# Patient Record
Sex: Female | Born: 1995 | Race: White | Hispanic: No | Marital: Single
Health system: Southern US, Community
[De-identification: ages and names within clinical notes are randomized; demographics above are authoritative.]

---

## 2017-08-06 ENCOUNTER — Encounter (HOSPITAL_COMMUNITY): Payer: Self-pay

## 2017-08-06 ENCOUNTER — Emergency Department (HOSPITAL_COMMUNITY)
Admission: EM | Admit: 2017-08-06 | Discharge: 2017-08-07 | Disposition: A | Discharge status: Home / Self Care | Payer: Self-pay | Attending: Emergency Medicine | Admitting: Emergency Medicine

## 2017-08-06 DIAGNOSIS — R1011 Right upper quadrant pain: Secondary | ICD-10-CM | POA: Insufficient documentation

## 2017-08-06 DIAGNOSIS — R1110 Vomiting, unspecified: Secondary | ICD-10-CM | POA: Insufficient documentation

## 2017-08-06 LAB — URINALYSIS, ROUTINE W REFLEX MICROSCOPIC
Bilirubin Urine: NEGATIVE
GLUCOSE, UA: NEGATIVE mg/dL
HGB URINE DIPSTICK: NEGATIVE
Ketones, ur: 80 mg/dL — AB
LEUKOCYTES UA: NEGATIVE
Nitrite: NEGATIVE
Protein, ur: NEGATIVE mg/dL
SPECIFIC GRAVITY, URINE: 1.018 (ref 1.005–1.030)
pH: 6 (ref 5.0–8.0)

## 2017-08-06 LAB — COMPREHENSIVE METABOLIC PANEL
ALT: 17 U/L (ref 14–54)
ANION GAP: 8 (ref 5–15)
AST: 21 U/L (ref 15–41)
Albumin: 4.4 g/dL (ref 3.5–5.0)
Alkaline Phosphatase: 44 U/L (ref 38–126)
BUN: 6 mg/dL (ref 6–20)
CHLORIDE: 105 mmol/L (ref 101–111)
CO2: 24 mmol/L (ref 22–32)
CREATININE: 0.61 mg/dL (ref 0.44–1.00)
Calcium: 9.1 mg/dL (ref 8.9–10.3)
GFR calc non Af Amer: >60 mL/min (ref >60)
Glucose, Bld: 100 mg/dL — ABNORMAL HIGH (ref 65–99)
POTASSIUM: 3.8 mmol/L (ref 3.5–5.1)
SODIUM: 137 mmol/L (ref 135–145)
Total Bilirubin: 0.6 mg/dL (ref 0.3–1.2)
Total Protein: 7.2 g/dL (ref 6.5–8.1)

## 2017-08-06 LAB — CBC
HEMATOCRIT: 36.2 % (ref 36.0–46.0)
HEMOGLOBIN: 12.1 g/dL (ref 12.0–15.0)
MCH: 30.4 pg (ref 26.0–34.0)
MCHC: 33.4 g/dL (ref 30.0–36.0)
MCV: 91 fL (ref 78.0–100.0)
Platelets: 180 10*3/uL (ref 150–400)
RBC: 3.98 MIL/uL (ref 3.87–5.11)
RDW: 12.9 % (ref 11.5–15.5)
WBC: 8.7 10*3/uL (ref 4.0–10.5)

## 2017-08-06 LAB — I-STAT BETA HCG BLOOD, ED (MC, WL, AP ONLY): I-stat hCG, quantitative: <5 m[IU]/mL (ref <5)

## 2017-08-06 LAB — LIPASE, BLOOD: LIPASE: 20 U/L (ref 11–51)

## 2017-08-06 MED ORDER — ONDANSETRON 4 MG PO TBDP
4.0000 mg | ORAL_TABLET | Freq: Once | ORAL | Status: AC
  Administered 2017-08-07: 4 mg via ORAL
  Filled 2017-08-06: qty 1

## 2017-08-06 MED ORDER — GI COCKTAIL ~~LOC~~
30.0000 mL | Freq: Once | ORAL | Status: AC
  Administered 2017-08-07: 30 mL via ORAL
  Filled 2017-08-06: qty 30

## 2017-08-06 MED ORDER — ACETAMINOPHEN 325 MG PO TABS
650.0000 mg | ORAL_TABLET | Freq: Once | ORAL | Status: AC
  Administered 2017-08-07: 650 mg via ORAL
  Filled 2017-08-06: qty 2

## 2017-08-06 NOTE — ED Triage Notes (Addendum)
PT states that at work today she had an episode of mid, central abdominal pain and emesis that lasted about 45 mins accompanied by shaking and dizziness. Currently she feels better, but endorses epigastric pain. A&Ox4. Ambulatory.

## 2017-08-07 ENCOUNTER — Emergency Department (HOSPITAL_COMMUNITY): Payer: Self-pay

## 2017-08-07 MED ORDER — OMEPRAZOLE 20 MG PO CPDR: 20.0000 mg | DELAYED_RELEASE_CAPSULE | Freq: Every day | ORAL | 0 refills | Status: AC

## 2017-08-07 MED ORDER — ONDANSETRON 4 MG PO TBDP: 4.0000 mg | ORAL_TABLET | Freq: Three times a day (TID) | ORAL | 0 refills | Status: AC | PRN

## 2017-08-07 NOTE — Discharge Instructions (Signed)
All the results in the ER are normal, labs and imaging. We are not sure what is causing your symptoms. The workup in the ER is not complete, and is limited to screening for life threatening and emergent conditions only, so please see a primary care doctor for further evaluation. Start with clear liquid diet for the next 1-2 days and then slowly advance to formed foods.

## 2017-08-07 NOTE — ED Provider Notes (Signed)
[facility]-EMERGENCY DEPT Provider Note   CSN: [id number] Arrival date & time: 08/06/17  1818     History   Chief Complaint Chief Complaint  Patient presents with  . Emesis  . Abdominal Pain    HPI Caroline Pearson is a 21 y.o. female.  HPI Patient comes in with chief complaint of epigastric abdominal pain. Patient has history of gluten sensitivity. Patient reports that she was feeling well and went on to work, however around 5:00 she started having cramping in the epigastrium along with nausea and vomiting. Patient has had 5 episodes of emesis, non bilious and non bloody. Patient's pain is now improved but still present. Patient also continues to have nausea with dry heaving has involved resolved. atient is absolutely certain that she did not have any food in it. Patient reports that over the past few weeks she has noticed increased bloating and abdominal discomfort. She isn't due to products and she does indicate family history of IBS and hepatobiliary disease.  History reviewed. No pertinent past medical history.  There are no active problems to display for this patient.   No past surgical history on file.  OB History    No data available       Home Medications    Prior to Admission medications   Not on File    Family History History reviewed. No pertinent family history.  Social History Social History  Substance Use Topics  . Smoking status: Not on file  . Smokeless tobacco: Not on file  . Alcohol use Not on file     Allergies   Patient has no known allergies.   Review of Systems Review of Systems  Constitutional: Positive for activity change.  Gastrointestinal: Positive for abdominal pain, nausea and vomiting.  Genitourinary: Negative for dysuria.  Allergic/Immunologic: Positive for food allergies.     Physical Exam Updated Vital Signs BP 107/81 (BP Location: Left Arm)   Pulse 99   Temp 97.7 F (36.5 C) (Oral)   Resp (!) 24   Ht  (1.6 m)    Wt 61.2 kg (135 lb)   SpO2 100%   BMI 23.91 kg/m   Physical Exam  Constitutional: She is oriented to person, place, and time. She appears well-developed.  HENT:  Head: Normocephalic and atraumatic.  Eyes: EOM are normal.  Neck: Normal range of motion. Neck supple.  Cardiovascular: Normal rate.   Pulmonary/Chest: Effort normal.  Abdominal: Bowel sounds are normal. There is tenderness. There is no rebound and no guarding.  Epigastric and RUQ tenderness  Neurological: She is alert and oriented to person, place, and time.  Skin: Skin is warm and dry.  Nursing note and vitals reviewed.    ED Treatments / Results  Labs (all labs ordered are listed, but only abnormal results are displayed) Labs Reviewed  COMPREHENSIVE METABOLIC PANEL - Abnormal; Notable for the following:       Result Value   Glucose, Bld 100 (*)    All other components within normal limits  URINALYSIS, ROUTINE W REFLEX MICROSCOPIC - Abnormal; Notable for the following:    Ketones, ur 80 (*)    All other components within normal limits  LIPASE, BLOOD  CBC  I-STAT BETA HCG BLOOD, ED ([facility], [facility], [facility] ONLY)    EKG  EKG Interpretation None       Radiology No results found.  Procedures Procedures (including critical care time)  Medications Ordered in ED Medications  ondansetron (ZOFRAN-ODT) disintegrating tablet 4 mg (not administered)  acetaminophen (TYLENOL) tablet 650 mg (not administered)  gi cocktail (Maalox,Lidocaine,Donnatal) (not administered)     Initial Impression / Assessment and Plan / ED Course  I have reviewed the triage vital signs and the nursing notes.  Pertinent labs & imaging results that were available during my care of the patient were reviewed by me and considered in my medical decision making (see chart for details).  Clinical Course as of Aug 08 115  Thu Aug 07, 2017  0116 Results from the ER workup discussed with the patient face to face and all questions answered to the best  of my ability.  US Abdomen Limited RUQ [AN]    Clinical Course User Index [AN] Derwood Kaplan, MD    DDx includes: Pancreatitis Hepatobiliary pathology including cholecystitis Gastritis/PUD  Patient comes in with epigastric and right upper quadrant abdominal pain that started at 5 PM, with associated nausea and vomiting patient's family history of hepatobiliary disorder. UPPER QUADRANT AND GI LAB 7 ORDERED. PATIENT'S PAIN HAS IMPROVED.        Final Clinical Impressions(s) / ED Diagnoses   Final diagnoses:  RUQ abdominal pain    New Prescriptions New Prescriptions   No medications on file     Derwood Kaplan, MD 08/07/17 0116

## 2019-04-24 IMAGING — US US ABDOMEN LIMITED
1 series · 14 of 25 positions shown · non-contrast
Comparison: None.

CLINICAL DATA: Right upper quadrant pain with nausea and vomiting.

EXAM:
ULTRASOUND ABDOMEN LIMITED RIGHT UPPER QUADRANT

[Series 1: us abdomen limited · 0.14mm/px · 14 of 52 slices shown]
[im 1/52]
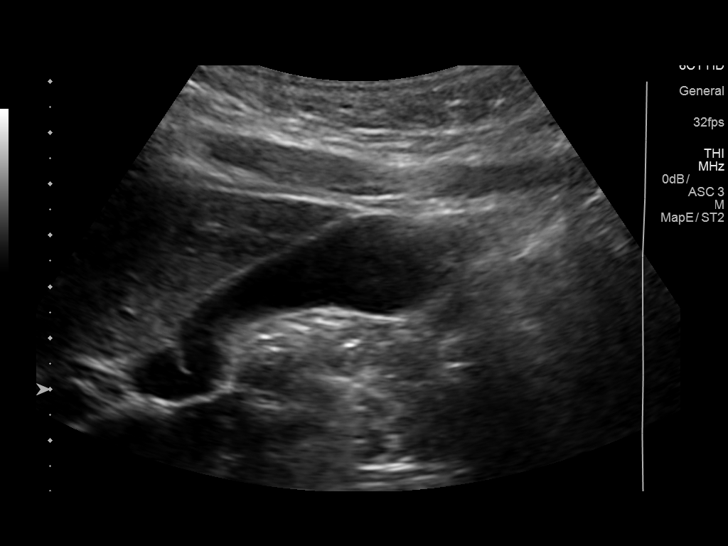
[im 5/52]
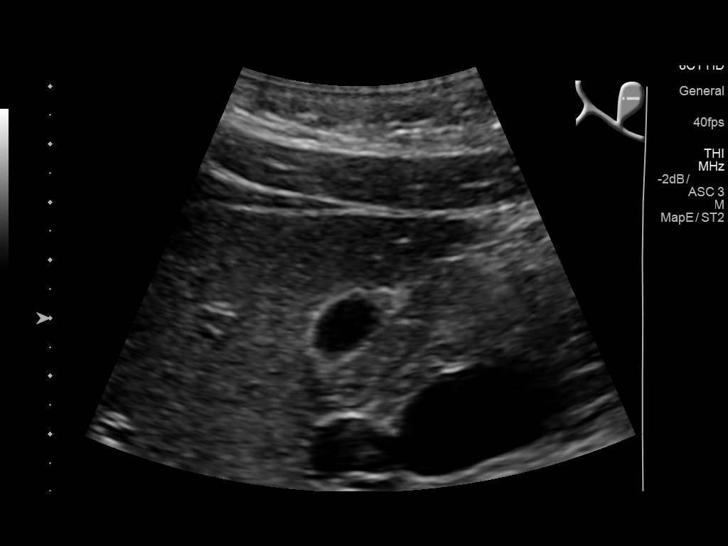
[im 9/52]
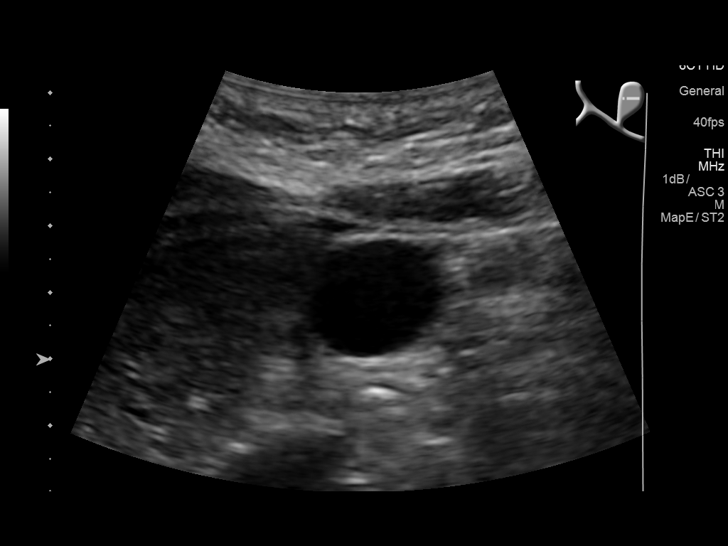
[im 13/52]
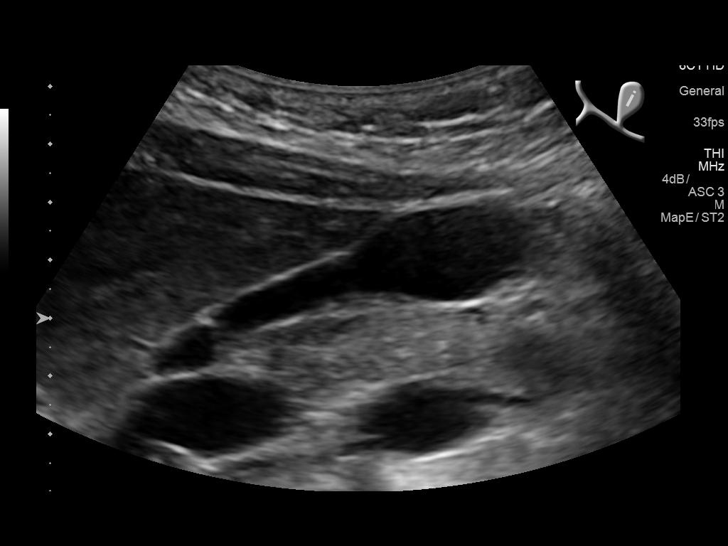
[im 18/52]
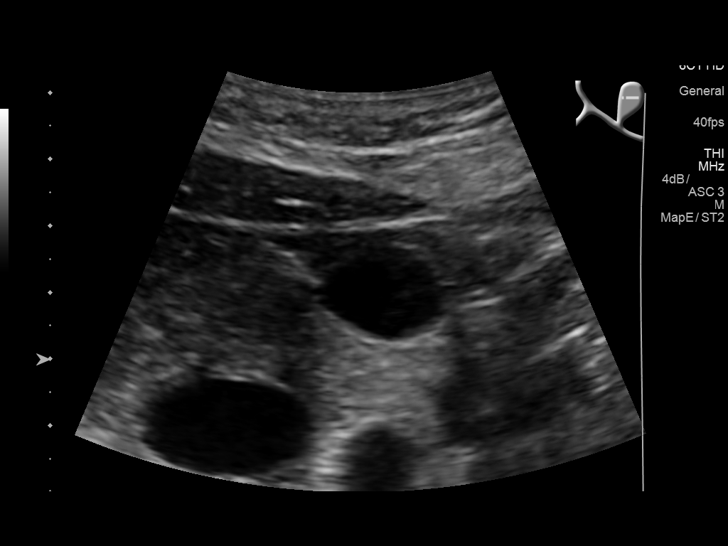
[im 20/52]
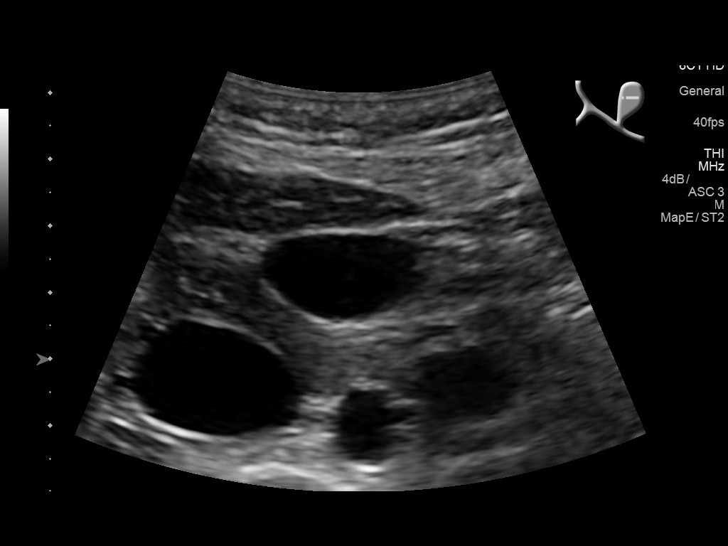
[im 24/52]
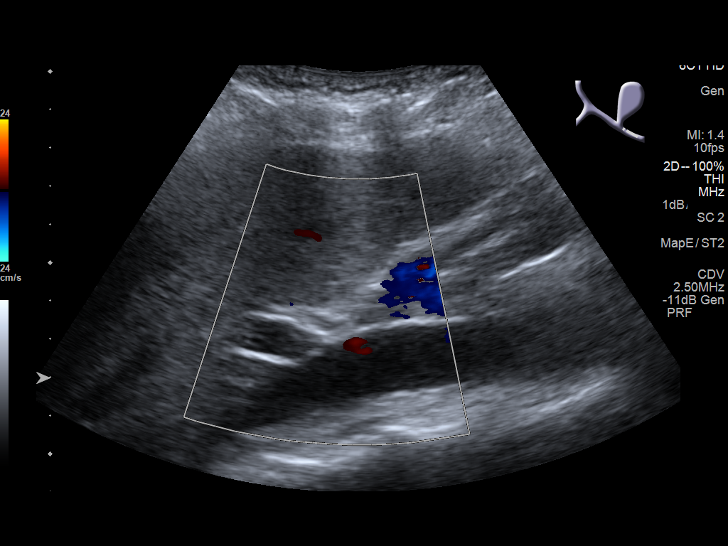
[im 28/52]
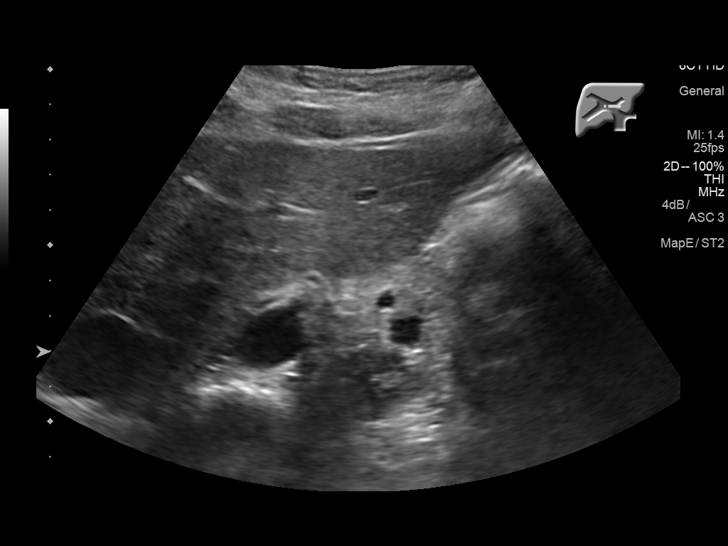
[im 32/52]
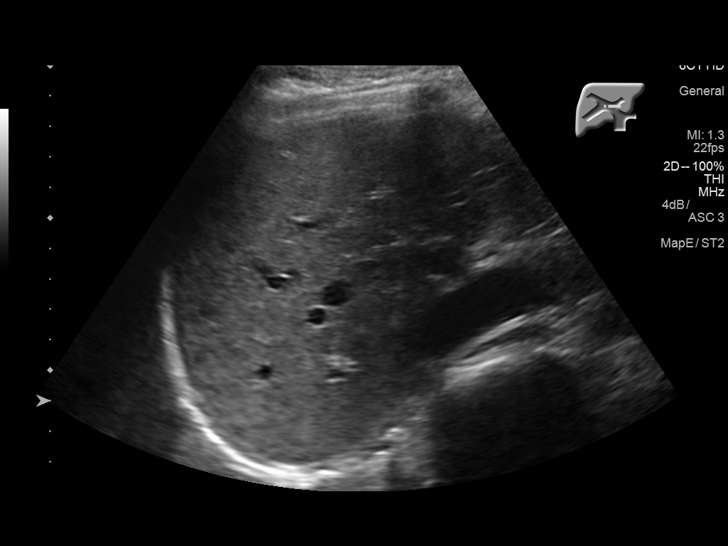
[im 35/52]
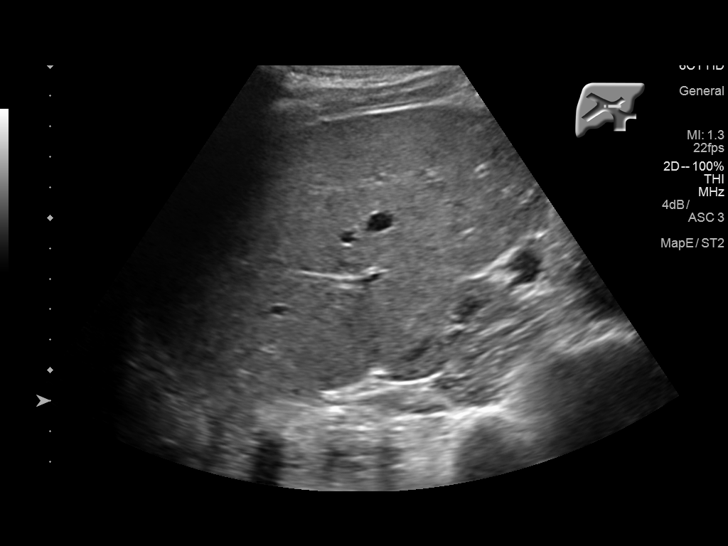
[im 39/52]
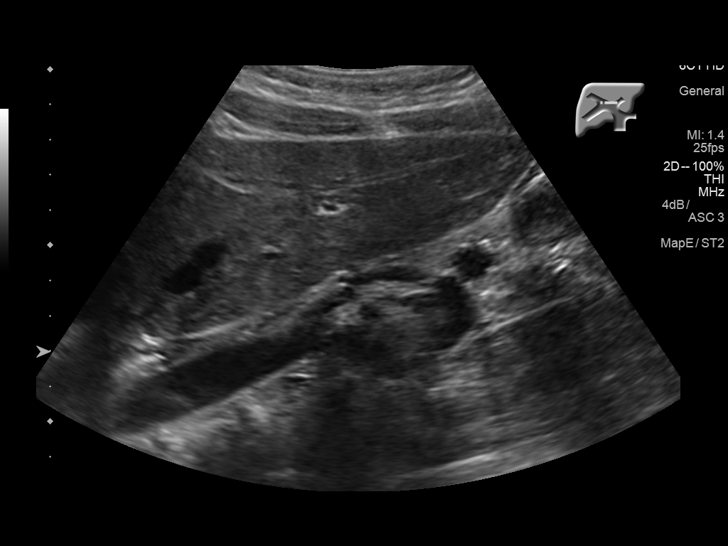
[im 43/52]
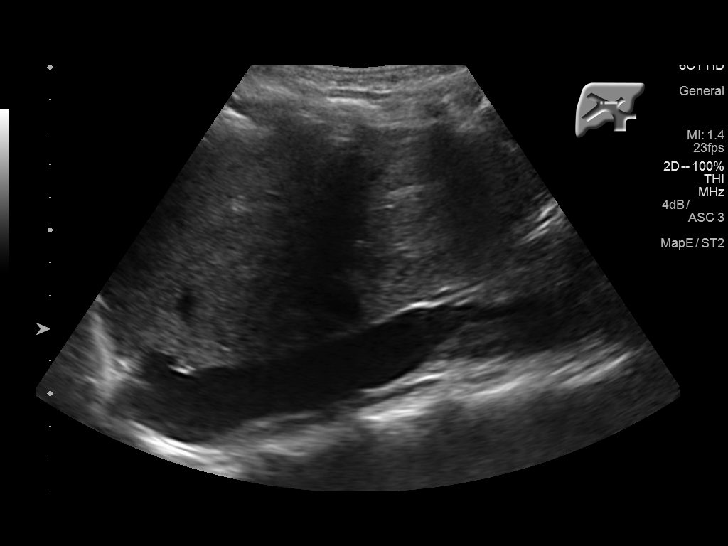
[im 47/52]
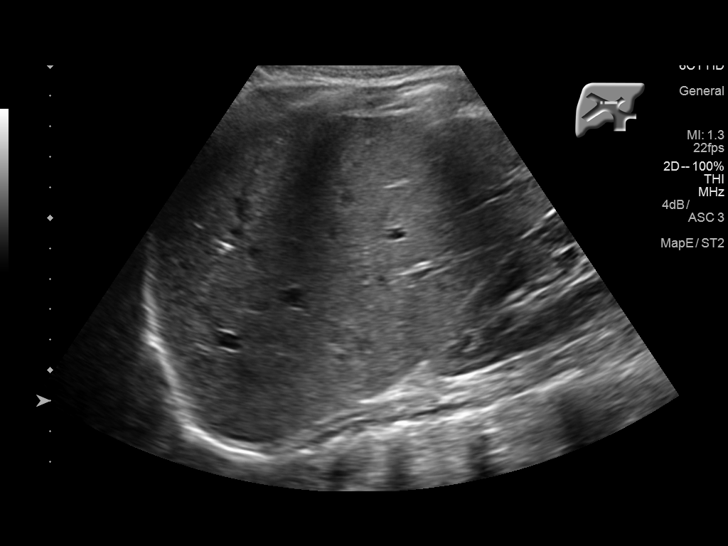
[im 52/52]
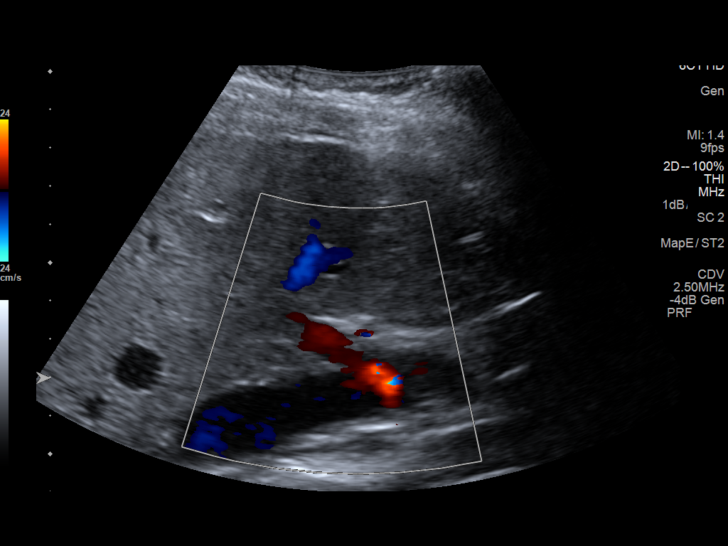

[14 of 25 positions shown; findings below may reference images not displayed]

FINDINGS: Gallbladder:

No gallstones or wall thickening visualized. No sonographic Murphy
sign noted by sonographer.

Common bile duct:

Diameter: 4.3 mm.

Liver:

No focal lesion identified. Within normal limits in parenchymal
echogenicity. Portal vein is patent on color Doppler imaging with
normal direction of blood flow towards the liver.
IMPRESSION: Normal right upper quadrant sound.
